# Patient Record
Sex: Male | Born: 1970 | Race: White | Hispanic: Yes | Marital: Single | Smoking: Current every day smoker
Health system: Southern US, Community
[De-identification: ages and names within clinical notes are randomized; demographics above are authoritative.]

## PROBLEM LIST (undated history)

## (undated) DIAGNOSIS — R55 Syncope and collapse: Secondary | ICD-10-CM

## (undated) HISTORY — PX: APPENDECTOMY: SHX54

---

## 2003-12-11 ENCOUNTER — Emergency Department (HOSPITAL_COMMUNITY): Admission: EM | Admit: 2003-12-11 | Discharge: 2003-12-11 | Payer: Self-pay | Admitting: *Deleted

## 2003-12-17 ENCOUNTER — Emergency Department (HOSPITAL_COMMUNITY): Admission: EM | Admit: 2003-12-17 | Discharge: 2003-12-17 | Payer: Self-pay | Admitting: Emergency Medicine

## 2003-12-17 ENCOUNTER — Emergency Department (HOSPITAL_COMMUNITY): Admission: EM | Admit: 2003-12-17 | Discharge: 2003-12-17 | Payer: Self-pay

## 2003-12-21 ENCOUNTER — Emergency Department (HOSPITAL_COMMUNITY): Admission: EM | Admit: 2003-12-21 | Discharge: 2003-12-21 | Payer: Self-pay | Admitting: Emergency Medicine

## 2005-12-27 IMAGING — CR DG CHEST 2V
2 series · 2 of 2 positions shown · non-contrast
Comparison: none

CLINICAL DATA: 33-year-old male ? left lower chest pain.  
 CHEST (TWO VIEWS) 12/11/03

[view not recorded (1 of 2)]
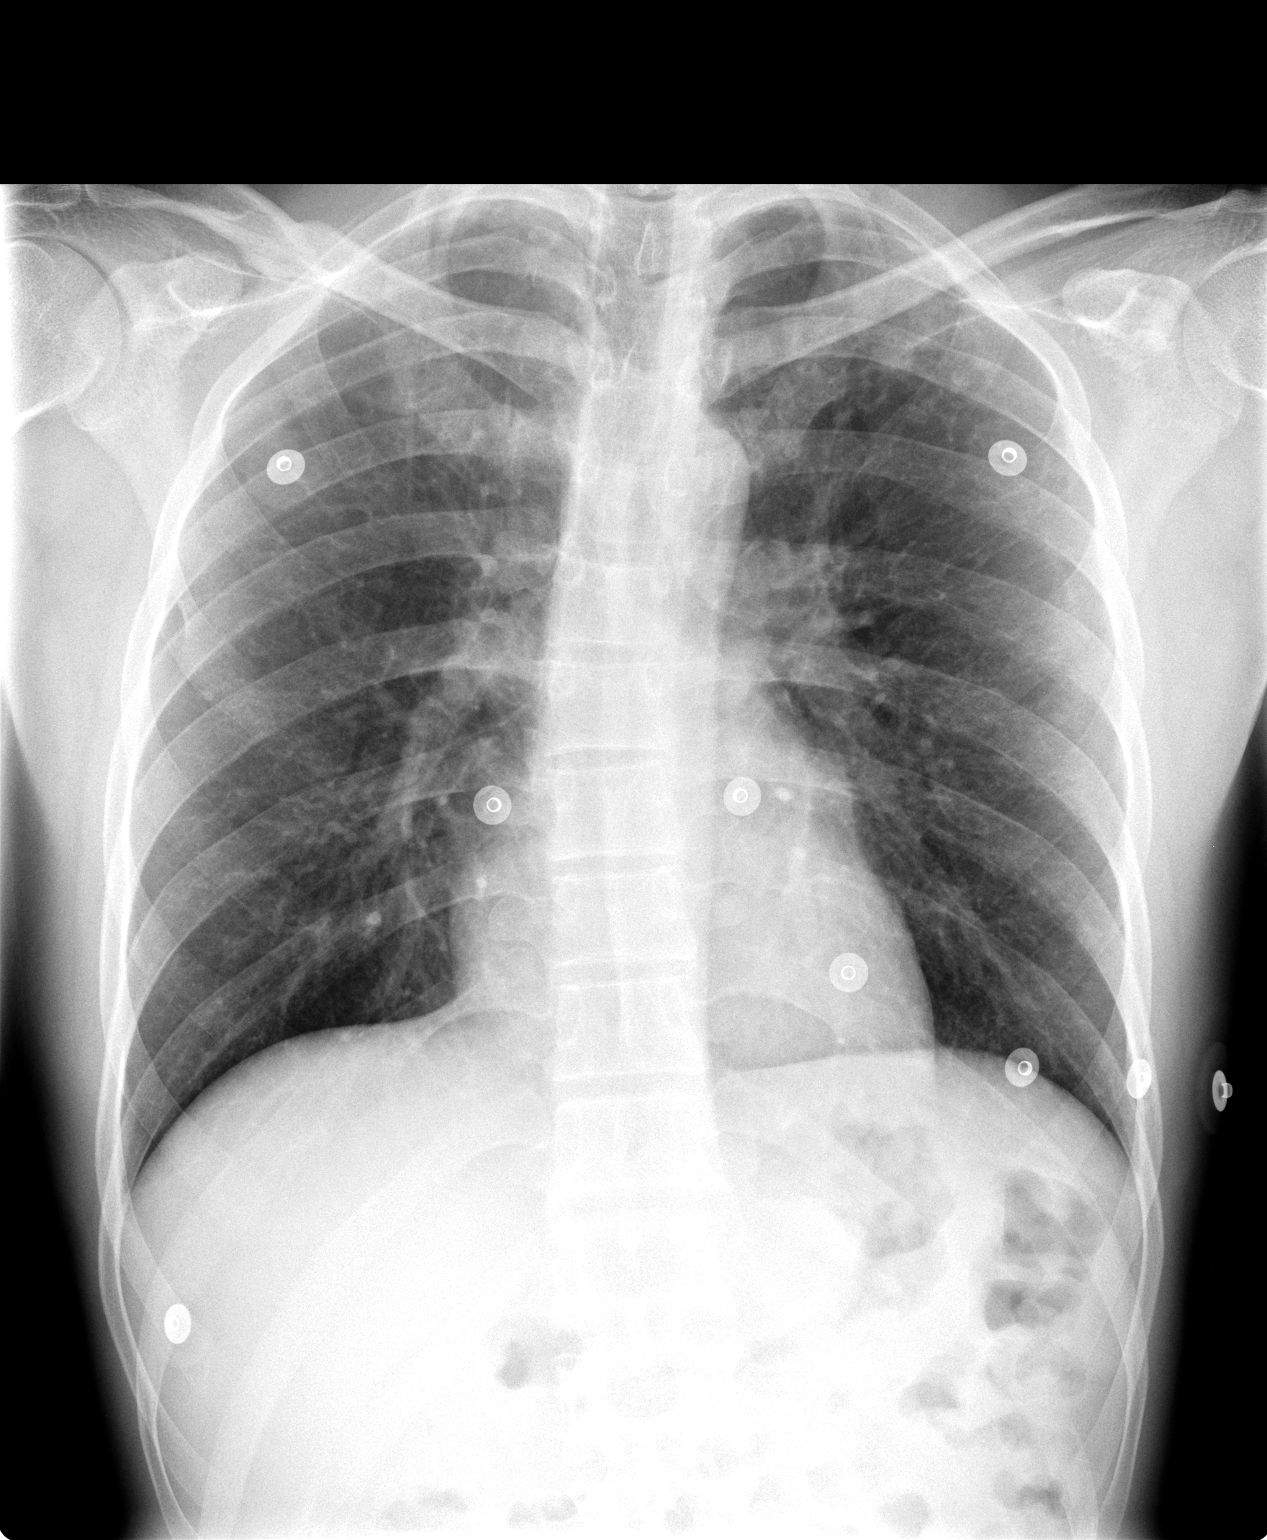

[view not recorded (2 of 2)]
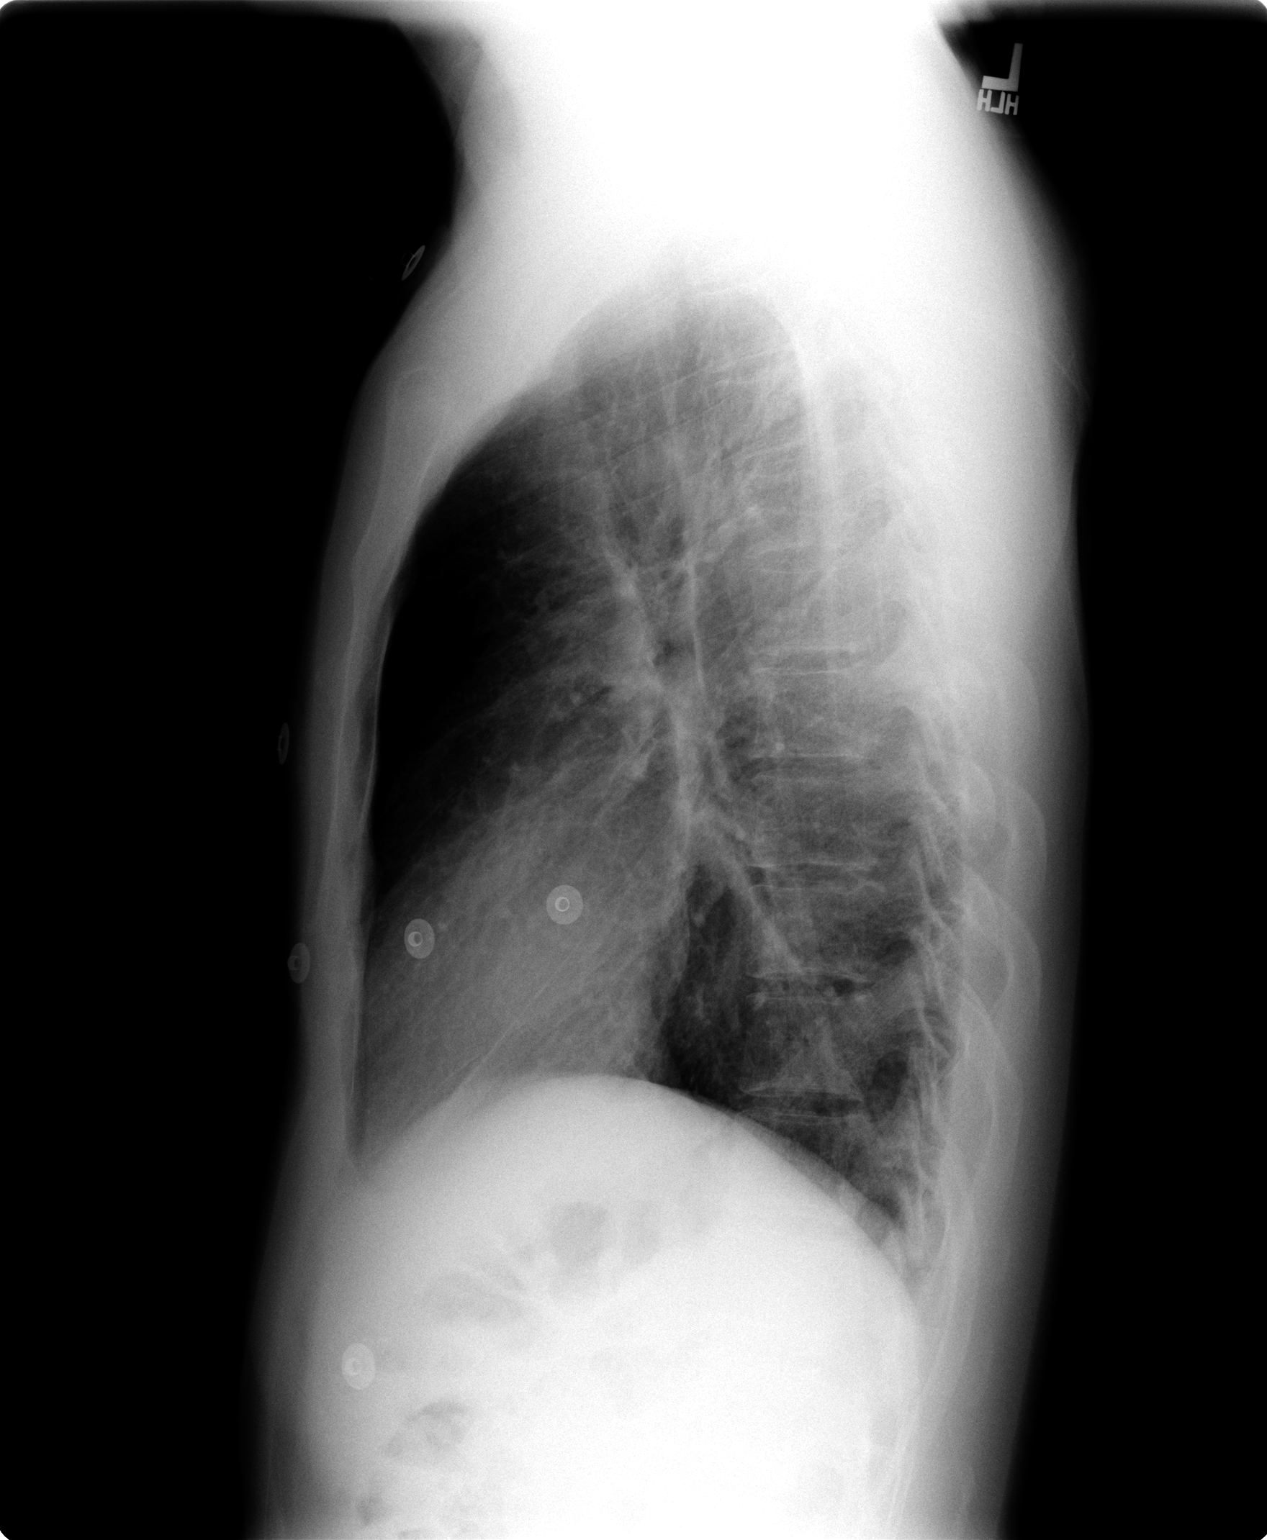

[2 of 2 positions shown; findings below may reference images not displayed]

FINDINGS: Minimal diffuse increased interstitial markings throughout the lungs with mild nodularity particularly in the left upper lobe and right mid lung.  There are no available comparisons.  Findings are nonspecific but could represent chronic interstitial disease versus atypical infection such as viral or fungal etiologies.  Recommend correlation clinically.  Heart size is normal.  No effusion.  
 IMPRESSION
 Diffuse mild reticular nodular interstitial opacities in the lungs most pronounced in the left upper lobe and right mid lung.  Chronic changes versus atypical infection.

## 2008-10-01 ENCOUNTER — Emergency Department (HOSPITAL_COMMUNITY): Admission: EM | Admit: 2008-10-01 | Discharge: 2008-10-01 | Payer: Self-pay | Admitting: Emergency Medicine

## 2010-06-13 LAB — BASIC METABOLIC PANEL
BUN: 8 mg/dL (ref 6–23)
CO2: 26 mEq/L (ref 19–32)
Calcium: 9.2 mg/dL (ref 8.4–10.5)
Chloride: 107 mEq/L (ref 96–112)
Creatinine, Ser: 0.68 mg/dL (ref 0.4–1.5)
GFR calc Af Amer: >60 mL/min (ref >60)
GFR calc non Af Amer: >60 mL/min (ref >60)
Glucose, Bld: 94 mg/dL (ref 70–99)
Potassium: 3.4 mEq/L — ABNORMAL LOW (ref 3.5–5.1)
Sodium: 136 mEq/L (ref 135–145)

## 2010-06-13 LAB — DIFFERENTIAL
Basophils Absolute: 0 10*3/uL (ref 0.0–0.1)
Basophils Relative: 1 % (ref 0–1)
Eosinophils Absolute: 0.2 10*3/uL (ref 0.0–0.7)
Eosinophils Relative: 3 % (ref 0–5)
Lymphocytes Relative: 40 % (ref 12–46)
Lymphs Abs: 2.5 10*3/uL (ref 0.7–4.0)
Monocytes Absolute: 0.4 10*3/uL (ref 0.1–1.0)
Monocytes Relative: 7 % (ref 3–12)
Neutro Abs: 3.1 10*3/uL (ref 1.7–7.7)
Neutrophils Relative %: 50 % (ref 43–77)

## 2010-06-13 LAB — CBC
HCT: 45.7 % (ref 39.0–52.0)
Hemoglobin: 15.1 g/dL (ref 13.0–17.0)
MCHC: 33.1 g/dL (ref 30.0–36.0)
MCV: 94.2 fL (ref 78.0–100.0)
Platelets: 188 10*3/uL (ref 150–400)
RBC: 4.85 MIL/uL (ref 4.22–5.81)
RDW: 13.2 % (ref 11.5–15.5)
WBC: 6.1 10*3/uL (ref 4.0–10.5)

## 2010-06-13 LAB — URINALYSIS, ROUTINE W REFLEX MICROSCOPIC
Bilirubin Urine: NEGATIVE
Glucose, UA: NEGATIVE mg/dL
Hgb urine dipstick: NEGATIVE
Ketones, ur: NEGATIVE mg/dL
Nitrite: NEGATIVE
Protein, ur: NEGATIVE mg/dL
Specific Gravity, Urine: 1.013 (ref 1.005–1.030)
Urobilinogen, UA: 0.2 mg/dL (ref 0.0–1.0)
pH: 7 (ref 5.0–8.0)

## 2010-06-13 LAB — POCT CARDIAC MARKERS
CKMB, poc: 1.7 ng/mL (ref 1.0–8.0)
Myoglobin, poc: 55.1 ng/mL (ref 12–200)
Troponin i, poc: <0.05 ng/mL (ref 0.00–0.09)

## 2015-08-15 ENCOUNTER — Emergency Department (HOSPITAL_COMMUNITY): Payer: Self-pay

## 2015-08-15 ENCOUNTER — Observation Stay (HOSPITAL_COMMUNITY)
Admission: EM | Admit: 2015-08-15 | Discharge: 2015-08-17 | Disposition: A | Discharge status: Home / Self Care | Payer: Self-pay | Attending: Internal Medicine | Admitting: Internal Medicine

## 2015-08-15 ENCOUNTER — Encounter (HOSPITAL_COMMUNITY): Payer: Self-pay | Admitting: Emergency Medicine

## 2015-08-15 DIAGNOSIS — F1290 Cannabis use, unspecified, uncomplicated: Secondary | ICD-10-CM | POA: Diagnosis present

## 2015-08-15 DIAGNOSIS — R55 Syncope and collapse: Principal | ICD-10-CM | POA: Insufficient documentation

## 2015-08-15 DIAGNOSIS — F17200 Nicotine dependence, unspecified, uncomplicated: Secondary | ICD-10-CM | POA: Insufficient documentation

## 2015-08-15 HISTORY — DX: Syncope and collapse: R55

## 2015-08-15 LAB — CBC
HCT: 38.9 % — ABNORMAL LOW (ref 39.0–52.0)
Hemoglobin: 13.3 g/dL (ref 13.0–17.0)
MCH: 30.9 pg (ref 26.0–34.0)
MCHC: 34.2 g/dL (ref 30.0–36.0)
MCV: 90.3 fL (ref 78.0–100.0)
Platelets: 210 10*3/uL (ref 150–400)
RBC: 4.31 MIL/uL (ref 4.22–5.81)
RDW: 12.8 % (ref 11.5–15.5)
WBC: 11.4 10*3/uL — ABNORMAL HIGH (ref 4.0–10.5)

## 2015-08-15 LAB — BASIC METABOLIC PANEL
Anion gap: 7 (ref 5–15)
BUN: 14 mg/dL (ref 6–20)
CO2: 20 mmol/L — ABNORMAL LOW (ref 22–32)
Calcium: 8.4 mg/dL — ABNORMAL LOW (ref 8.9–10.3)
Chloride: 107 mmol/L (ref 101–111)
Creatinine, Ser: 0.93 mg/dL (ref 0.61–1.24)
GFR calc Af Amer: >60 mL/min (ref >60)
GFR calc non Af Amer: >60 mL/min (ref >60)
Glucose, Bld: 149 mg/dL — ABNORMAL HIGH (ref 65–99)
Potassium: 3.3 mmol/L — ABNORMAL LOW (ref 3.5–5.1)
Sodium: 134 mmol/L — ABNORMAL LOW (ref 135–145)

## 2015-08-15 LAB — I-STAT CHEM 8, ED
BUN: 17 mg/dL (ref 6–20)
Calcium, Ion: 1.06 mmol/L — ABNORMAL LOW (ref 1.12–1.23)
Chloride: 103 mmol/L (ref 101–111)
Creatinine, Ser: 0.9 mg/dL (ref 0.61–1.24)
Glucose, Bld: 151 mg/dL — ABNORMAL HIGH (ref 65–99)
HCT: 42 % (ref 39.0–52.0)
Hemoglobin: 14.3 g/dL (ref 13.0–17.0)
Potassium: 3.3 mmol/L — ABNORMAL LOW (ref 3.5–5.1)
Sodium: 140 mmol/L (ref 135–145)
TCO2: 22 mmol/L (ref 0–100)

## 2015-08-15 LAB — PROTIME-INR
INR: 1.23 (ref 0.00–1.49)
Prothrombin Time: 15.6 seconds — ABNORMAL HIGH (ref 11.6–15.2)

## 2015-08-15 LAB — I-STAT TROPONIN, ED: Troponin i, poc: 0.01 ng/mL (ref 0.00–0.08)

## 2015-08-15 LAB — PHOSPHORUS: Phosphorus: 2.2 mg/dL — ABNORMAL LOW (ref 2.5–4.6)

## 2015-08-15 LAB — MAGNESIUM: Magnesium: 2 mg/dL (ref 1.7–2.4)

## 2015-08-15 MED ORDER — POTASSIUM CHLORIDE CRYS ER 20 MEQ PO TBCR
40.0000 meq | EXTENDED_RELEASE_TABLET | Freq: Once | ORAL | Status: AC
  Administered 2015-08-16: 40 meq via ORAL
  Filled 2015-08-15: qty 2

## 2015-08-15 NOTE — ED Provider Notes (Signed)
CSN: [id number]     Arrival date & time 08/15/15  2142 History   First MD Initiated Contact with Patient 08/15/15 2147     I am a certified bilingual provider. History and physical obtained in Spanish as preferred by patient and wife.  Chief Complaint  Patient presents with  . Shortness of Breath   Patient is a 45 y.o. male presenting with shortness of breath.  Shortness of Breath Severity:  Severe Onset quality:  Sudden Timing:  Sporadic Progression:  Resolved Chronicity:  Recurrent Context: not activity and not URI   Relieved by:  Nothing Ineffective treatments:  Rest (ASA, nitro) Associated symptoms: diaphoresis   Associated symptoms: no abdominal pain, no chest pain, no claudication, no cough and no hemoptysis   Risk factors: no hx of cancer, no hx of PE/DVT, no obesity, no prolonged immobilization and no recent surgery    Previously healthy male with no history of hypertension or diabetes but who does endorse tobacco abuse presents with syncopal episode. Patient states he was in his usual state of health when he suddenly felt short of breath, lightheaded, diaphoretic and with numbness to his left upper extremity. He subsequently passed out for a few seconds. EMS was called and they obtain an EKG that was concerning for STEMI. He received aspirin, nitroglycerin which did not relief him.  Past Medical History  Diagnosis Date  . Syncope    Past Surgical History  Procedure Laterality Date  . Appendectomy     Family History  Problem Relation Age of Onset  . Syncope episode Neg Hx    Social History  Substance Use Topics  . Smoking status: Current Every Day Smoker  . Smokeless tobacco: None  . Alcohol Use: Yes    Review of Systems  Constitutional: Positive for diaphoresis.  Respiratory: Positive for shortness of breath. Negative for cough and hemoptysis.   Cardiovascular: Negative for chest pain and claudication.  Gastrointestinal: Negative for abdominal pain.       Allergies  Review of patient's allergies indicates no known allergies.  Home Medications   Prior to Admission medications   Not on File   BP 104/63 mmHg  Pulse 82  Temp(Src) 97.8 F (36.6 C) (Oral)  Resp 16  SpO2 100% Physical Exam  Constitutional: He appears well-developed and well-nourished. No distress.  HENT:  Head: Normocephalic and atraumatic.  Left Ear: External ear normal.  Eyes: Conjunctivae are normal. Pupils are equal, round, and reactive to light. Right eye exhibits no discharge. Left eye exhibits no discharge.  Neck: Normal range of motion. Neck supple.  Cardiovascular: Normal rate and regular rhythm.   No murmur heard. Pulmonary/Chest: Effort normal and breath sounds normal. No respiratory distress.  Abdominal: Soft. Bowel sounds are normal. He exhibits no distension and no mass. There is no tenderness. There is no rebound and no guarding.  Musculoskeletal: He exhibits no edema (of LEs).  Neurological: He is alert.  Skin: Skin is warm. He is not diaphoretic.  Psychiatric: He has a normal mood and affect.  Nursing note and vitals reviewed.   ED Course  Procedures  Labs Review Labs Reviewed  BASIC METABOLIC PANEL - Abnormal; Notable for the following:    Sodium 134 (*)    Potassium 3.3 (*)    CO2 20 (*)    Glucose, Bld 149 (*)    Calcium 8.4 (*)    All other components within normal limits  CBC - Abnormal; Notable for the following:    WBC 11.4 (*)  HCT 38.9 (*)    All other components within normal limits  PROTIME-INR - Abnormal; Notable for the following:    Prothrombin Time 15.6 (*)    All other components within normal limits  PHOSPHORUS - Abnormal; Notable for the following:    Phosphorus 2.2 (*)    All other components within normal limits  BASIC METABOLIC PANEL - Abnormal; Notable for the following:    CO2 20 (*)    Glucose, Bld 110 (*)    Calcium 8.8 (*)    All other components within normal limits  URINE RAPID DRUG SCREEN,  HOSP PERFORMED - Abnormal; Notable for the following:    Tetrahydrocannabinol POSITIVE (*)    All other components within normal limits  BASIC METABOLIC PANEL - Abnormal; Notable for the following:    Glucose, Bld 104 (*)    All other components within normal limits  I-STAT CHEM 8, ED - Abnormal; Notable for the following:    Potassium 3.3 (*)    Glucose, Bld 151 (*)    Calcium, Ion 1.06 (*)    All other components within normal limits  MAGNESIUM  TROPONIN I  TROPONIN I  TROPONIN I  CBC  CREATININE, SERUM  CBC  CK  PHOSPHORUS  I-STAT TROPOININ, ED    Imaging Review Ct Angio Chest Pe W/cm &/or Wo Cm  08/16/2015  CLINICAL DATA:  45 year old male with syncope. EXAM: CT ANGIOGRAPHY CHEST WITH CONTRAST TECHNIQUE: Multidetector CT imaging of the chest was performed using the standard protocol during bolus administration of intravenous contrast. Multiplanar CT image reconstructions and MIPs were obtained to evaluate the vascular anatomy. CONTRAST:  100 cc Isovue 370 COMPARISON:  Chest radiograph dated 08/15/2015 FINDINGS: There are biapical scarring. A 6 x 13 mm area of nodularity in the left upper lobe extending into the left pleural surface likely represent scarring. A 7 mm nodular density in the left apical region noted, also possibly scarring. Direct comparison with prior chest CTs, if available, or follow-up recommended to document stability. The lungs are otherwise clear. A 4 mm calcific focus in the right middle lobe likely related to old granuloma. There is no pleural effusion or pneumothorax. The central airways are patent. The thoracic aorta appears unremarkable. There is no CT evidence of pulmonary embolism. There is no cardiomegaly or pericardial effusion. There is no hilar or mediastinal adenopathy. The esophagus is grossly unremarkable. There is no axillary adenopathy. The chest wall soft tissues appear unremarkable. The osseous structures are intact. The visualized upper abdomen is  unremarkable. Review of the MIP images confirms the above findings. IMPRESSION: No CT evidence of pulmonary embolism. Biapical nodularity and scarring. A 7 mm left upper lobe pulmonary nodules versus scarring. Non-contrast chest CT at 6-12 months is recommended. If the nodule is stable at time of repeat CT, then future CT at 18-24 months (from today's scan) is considered optional for low-risk patients, but is recommended for high-risk patients. This recommendation follows the consensus statement: Guidelines for Management of Incidental Pulmonary Nodules Detected on CT Images:From the Fleischner Society 2017; published online before print (10.1148/radiol.9604540981). Electronically Signed   By: Elgie Collard M.D.   On: 08/16/2015 02:11   Dg Chest Portable 1 View  08/15/2015  CLINICAL DATA:  Shortness of breath. EXAM: PORTABLE CHEST 1 VIEW COMPARISON:  10/01/2008. FINDINGS: The normal sized heart. Probable heart shaped artifact overlying the left hilar region with associated leads. Otherwise, clear lungs. Mild scoliosis. IMPRESSION: No acute abnormality. Electronically Signed   By: Viviann Spare  Azucena Kuba M.D.   On: 08/15/2015 22:15   I have personally reviewed and evaluated these images and lab results as part of my medical decision-making.   EKG Interpretation None      MDM   Final diagnoses:  Syncope and collapse  Syncope    History had a concerning for arrhythmia however initial EKG without obvious incident. Could send initially called but given EKG at baseline when compared to prior, canceled per cardiology. Patient has had multiple similar episodes in the past, most recent 4 months ago. No history of sudden cardiac death in the family. Patient otherwise healthy except for tobacco abuse. Denies any drug use except marijuana. Concern for dissection versus PE. Blood pressure and pulse not significantly different between 2 arms and has bilateral pulses. Labs unremarkable except for mild hypokalemia, which  we replaced. CTPA obtained to further assess and negative. Will admit to syncope are brisk for further management and observation.   Sidney Ace, MD 08/17/15 1440  Alvira Monday, MD 08/19/15 [id number]

## 2015-08-15 NOTE — ED Notes (Signed)
Pt. arrived with EMS from home report SOB with chest tightness and brief LOC onset this evening , denies chest pain at arrival , received ASA 324 mg. po and 2 NTG sl with no relief.

## 2015-08-15 NOTE — Progress Notes (Signed)
   08/15/15 2200  Clinical Encounter Type  Visited With Family  Visit Type ED  Referral From Nurse  Spiritual Encounters  Spiritual Needs Emotional  Stress Factors  Family Stress Factors Health changes;Other (Comment) (language issues)  Provided hospitality to wife of patient. Emiah Pellicano, Chaplain

## 2015-08-15 NOTE — ED Notes (Addendum)
Dr. Arbutus PedMohamed ( cardiologist) cancelled code STEMI .

## 2015-08-16 ENCOUNTER — Observation Stay (HOSPITAL_COMMUNITY): Payer: MEDICAID

## 2015-08-16 ENCOUNTER — Encounter (HOSPITAL_COMMUNITY): Payer: Self-pay | Admitting: Family Medicine

## 2015-08-16 ENCOUNTER — Observation Stay (HOSPITAL_COMMUNITY): Payer: Self-pay

## 2015-08-16 DIAGNOSIS — R55 Syncope and collapse: Secondary | ICD-10-CM | POA: Diagnosis present

## 2015-08-16 DIAGNOSIS — F1210 Cannabis abuse, uncomplicated: Secondary | ICD-10-CM

## 2015-08-16 DIAGNOSIS — F1290 Cannabis use, unspecified, uncomplicated: Secondary | ICD-10-CM | POA: Diagnosis present

## 2015-08-16 LAB — RAPID URINE DRUG SCREEN, HOSP PERFORMED
AMPHETAMINES: NOT DETECTED
Barbiturates: NOT DETECTED
Benzodiazepines: NOT DETECTED
Cocaine: NOT DETECTED
Opiates: NOT DETECTED
Tetrahydrocannabinol: POSITIVE — AB

## 2015-08-16 LAB — BASIC METABOLIC PANEL
Anion gap: 9 (ref 5–15)
BUN: 11 mg/dL (ref 6–20)
CALCIUM: 8.8 mg/dL — AB (ref 8.9–10.3)
CO2: 20 mmol/L — AB (ref 22–32)
CREATININE: 0.72 mg/dL (ref 0.61–1.24)
Chloride: 107 mmol/L (ref 101–111)
GFR calc Af Amer: >60 mL/min (ref >60)
GFR calc non Af Amer: >60 mL/min (ref >60)
GLUCOSE: 110 mg/dL — AB (ref 65–99)
Potassium: 3.7 mmol/L (ref 3.5–5.1)
Sodium: 136 mmol/L (ref 135–145)

## 2015-08-16 LAB — TROPONIN I

## 2015-08-16 LAB — CBC
HCT: 40.1 % (ref 39.0–52.0)
HCT: 40.6 % (ref 39.0–52.0)
HEMOGLOBIN: 13.9 g/dL (ref 13.0–17.0)
HEMOGLOBIN: 13.8 g/dL (ref 13.0–17.0)
MCH: 31.4 pg (ref 26.0–34.0)
MCH: 31 pg (ref 26.0–34.0)
MCHC: 34.7 g/dL (ref 30.0–36.0)
MCHC: 34 g/dL (ref 30.0–36.0)
MCV: 90.5 fL (ref 78.0–100.0)
MCV: 91.2 fL (ref 78.0–100.0)
PLATELETS: 222 10*3/uL (ref 150–400)
PLATELETS: 218 10*3/uL (ref 150–400)
RBC: 4.43 MIL/uL (ref 4.22–5.81)
RBC: 4.45 MIL/uL (ref 4.22–5.81)
RDW: 12.9 % (ref 11.5–15.5)
RDW: 12.9 % (ref 11.5–15.5)
WBC: 8 10*3/uL (ref 4.0–10.5)
WBC: 7.2 10*3/uL (ref 4.0–10.5)

## 2015-08-16 LAB — CREATININE, SERUM
CREATININE: 0.71 mg/dL (ref 0.61–1.24)
GFR calc Af Amer: >60 mL/min (ref >60)

## 2015-08-16 LAB — CK: CK TOTAL: 152 U/L (ref 49–397)

## 2015-08-16 MED ORDER — K PHOS MONO-SOD PHOS DI & MONO 155-852-130 MG PO TABS
500.0000 mg | ORAL_TABLET | Freq: Two times a day (BID) | ORAL | Status: AC
  Administered 2015-08-16 (×2): 500 mg via ORAL
  Filled 2015-08-16 (×2): qty 2

## 2015-08-16 MED ORDER — IOPAMIDOL (ISOVUE-370) INJECTION 76%
INTRAVENOUS | Status: AC
  Administered 2015-08-16: 100 mL
  Filled 2015-08-16: qty 100

## 2015-08-16 MED ORDER — SODIUM CHLORIDE 0.9% FLUSH
3.0000 mL | Freq: Two times a day (BID) | INTRAVENOUS | Status: DC
  Administered 2015-08-16 – 2015-08-17 (×3): 3 mL via INTRAVENOUS

## 2015-08-16 MED ORDER — SODIUM CHLORIDE 0.9 % IV SOLN
INTRAVENOUS | Status: AC
  Administered 2015-08-16: 125 mL/h via INTRAVENOUS

## 2015-08-16 MED ORDER — ENOXAPARIN SODIUM 40 MG/0.4ML ~~LOC~~ SOLN
40.0000 mg | Freq: Every day | SUBCUTANEOUS | Status: DC
  Filled 2015-08-16 (×2): qty 0.4

## 2015-08-16 NOTE — ED Notes (Signed)
MD at bedside. Hospitalist

## 2015-08-16 NOTE — H&P (Signed)
History and Physical    Dmarcus Decicco ZOX:[id number] DOB: 1970/12/05 DOA: 08/15/2015  PCP: No PCP Per Patient  Patient coming from: home  Interpreter used together this history, Spanish.  Chief Complaint: "I got worried because I passed out."  HPI: Justin Valentine is a 45 y.o. male with no medical history of significance. Patient states about 5 years ago he would have episodes of syncope but then they resolved. Patient states that over the last 5 months he's had episodes of syncope. He has an episode of syncope about every 1-3 weeks. Patient became alarmed today because he had 2 episodes of syncope in the same day. His episodes of syncope last about 1-3 minutes. Patient states that after he passes out he sometimes has some upper and lower extremity on the left side weakness and aching.  Patient states that he only sleeps about 6 hours night. Patient also works outside as a Education administrator and states that he drinks very little water and does very little to keep himself hydrated.  ED Course: Sardiology called for STEMI. It was decided that EKG did not reflect a STEMI. EKG at baseline. Patient now getting admitted for syncopal workup.  Review of Systems: As per HPI otherwise 10 point review of systems negative.    History reviewed. No pertinent past medical history.  Past Surgical History  Procedure Laterality Date  . Appendectomy       reports that he has been smoking.  He does not have any smokeless tobacco history on file. He reports that he drinks alcohol. He reports that he uses illicit drugs (Marijuana).  No Known Allergies  No family history on file.   Prior to Admission medications   Not on File    Physical Exam:  Constitutional: NAD, calm, comfortable Filed Vitals:   08/15/15 2215 08/15/15 2230 08/15/15 2316 08/16/15 0015  BP: 97/60 97/60  127/70  Pulse: 58 63  68  Temp:      TempSrc:      Resp: 14 15    SpO2: 99% 100% 100% 100%   Eyes: PERRL, lids and  conjunctivae normal ENMT: Mucous membranes are dry. Posterior pharynx clear of any exudate or lesions.Normal dentition.  Neck: normal, supple, no masses, no thyromegaly Respiratory: clear to auscultation bilaterally, no wheezing, no crackles. Normal respiratory effort. No accessory muscle use.  Cardiovascular: Regular rate and rhythm, no murmurs / rubs / gallops. No extremity edema. 2+ pedal pulses. No carotid bruits.  Abdomen: no tenderness, no masses palpated. No hepatosplenomegaly. Bowel sounds positive.  Musculoskeletal: no clubbing / cyanosis. No joint deformity upper and lower extremities. Good ROM, no contractures. Normal muscle tone.  Skin: no rashes, lesions, ulcers. No induration Neurologic: CN 2-12 grossly intact. Sensation intact, DTR normal. Strength 5/5 in all 4.  Psychiatric: Normal judgment and insight. Alert and oriented x 3. Normal mood.   Labs on Admission: I have personally reviewed following labs and imaging studies  CBC:  Recent Labs Lab 08/15/15 2149 08/15/15 2201  WBC 11.4*  --   HGB 13.3 14.3  HCT 38.9* 42.0  MCV 90.3  --   PLT 210  --    Basic Metabolic Panel:  Recent Labs Lab 08/15/15 2149 08/15/15 2201  NA 134* 140  K 3.3* 3.3*  CL 107 103  CO2 20*  --   GLUCOSE 149* 151*  BUN 14 17  CREATININE 0.93 0.90  CALCIUM 8.4*  --   MG 2.0  --   PHOS 2.2*  --  GFR: CrCl cannot be calculated (Unknown ideal weight.). Liver Function Tests: No results for input(s): AST, ALT, ALKPHOS, BILITOT, PROT, ALBUMIN in the last 168 hours. No results for input(s): LIPASE, AMYLASE in the last 168 hours. No results for input(s): AMMONIA in the last 168 hours. Coagulation Profile:  Recent Labs Lab 08/15/15 2149  INR 1.23   Cardiac Enzymes: No results for input(s): CKTOTAL, CKMB, CKMBINDEX, TROPONINI in the last 168 hours. BNP (last 3 results) No results for input(s): PROBNP in the last 8760 hours. HbA1C: No results for input(s): HGBA1C in the last 72  hours. CBG: No results for input(s): GLUCAP in the last 168 hours. Lipid Profile: No results for input(s): CHOL, HDL, LDLCALC, TRIG, CHOLHDL, LDLDIRECT in the last 72 hours. Thyroid Function Tests: No results for input(s): TSH, T4TOTAL, FREET4, T3FREE, THYROIDAB in the last 72 hours. Anemia Panel: No results for input(s): VITAMINB12, FOLATE, FERRITIN, TIBC, IRON, RETICCTPCT in the last 72 hours. Urine analysis:    Component Value Date/Time   COLORURINE YELLOW 10/01/2008 1250   APPEARANCEUR CLEAR 10/01/2008 1250   LABSPEC 1.013 10/01/2008 1250   PHURINE 7.0 10/01/2008 1250   GLUCOSEU NEGATIVE 10/01/2008 1250   HGBUR NEGATIVE 10/01/2008 1250   BILIRUBINUR NEGATIVE 10/01/2008 1250   KETONESUR NEGATIVE 10/01/2008 1250   PROTEINUR NEGATIVE 10/01/2008 1250   UROBILINOGEN 0.2 10/01/2008 1250   NITRITE NEGATIVE 10/01/2008 1250   LEUKOCYTESUR  10/01/2008 1250    NEGATIVE MICROSCOPIC NOT DONE ON URINES WITH NEGATIVE PROTEIN, BLOOD, LEUKOCYTES, NITRITE, OR GLUCOSE <1000 mg/dL.   Sepsis Labs: !!!!!!!!!!!!!!!!!!!!!!!!!!!!!!!!!!!!!!!!!!!! @LABRCNTIP (procalcitonin:4,lacticidven:4) )No results found for this or any previous visit (from the past 240 hour(s)).   Radiological Exams on Admission: Dg Chest Portable 1 View  08/15/2015  CLINICAL DATA:  Shortness of breath. EXAM: PORTABLE CHEST 1 VIEW COMPARISON:  10/01/2008. FINDINGS: The normal sized heart. Probable heart shaped artifact overlying the left hilar region with associated leads. Otherwise, clear lungs. Mild scoliosis. IMPRESSION: No acute abnormality. Electronically Signed   By: Beckie SaltsSteven  Reid M.D.   On: 08/15/2015 22:15    EKG: Independently reviewed. NSR  Assessment/Plan Principal Problem:   Syncope   Syncope Admitted for observation and placement of possible cardiac monitoring device Cardiology was spoken to the emergency room and felt that EKG as baseline Cardiology will follow patient the morning Orthostatics ordered Labs  consistent with dehydration as well as history Will hydrate patient Regular diet Check magnesium and alkaline phosphatase Serial troponins Will reassess the replaced left lites as needed   DVT prophylaxis: lovenox Code Status: full Family Communication: wife at bedside Disposition Plan: home Consults called: none Admission status: tele obs   Haydee SalterPhillip M Hobbs MD [facility] Pager [phone]  If 7PM-7AM, please contact night-coverage [email or web address] Password [email or web address]  08/16/2015, 12:50 AM

## 2015-08-16 NOTE — Consult Note (Signed)
Reason for Consult:   Syncope  Requesting Physician: Dr Tat Primary Cardiologist New (TT)  HPI:   45 y/o Hispanic male, works Development worker, communitydrywall, admitted after an episode of syncope. Pt interviewed through an interpreter. The pt says he has had syncope in the past- [facility]. He describes SOB followed by "sweating", then unresposiveness. He usually doesn't get these at work, only at rest. Today's episode while lying in bed. He has never had an injury related to these spells. He denies any chest pain or associated palpitations. He denies drug use except marijuana. His EKG was initially concerning for a STEMI but this was canceled by cardiologist. Troponin are negative x 3. Reportedly not orthostatic per RN.   PMHx:  Past Medical History  Diagnosis Date  . Syncope     Past Surgical History  Procedure Laterality Date  . Appendectomy      SOCHx:  reports that he has been smoking.  He does not have any smokeless tobacco history on file. He reports that he drinks alcohol. He reports that he uses illicit drugs (Marijuana).Work Insurance claims handlerhanging drywall.  FAMHx: Family History  Problem Relation Age of Onset  . Syncope episode Neg Hx   He denies any family history of CAD, MI, or syncope  ALLERGIES: No Known Allergies  ROS: Review of Systems: General: negative for chills, fever, night sweats or weight changes.  Cardiovascular: negative for chest pain, dyspnea on exertion, edema, orthopnea, palpitations, paroxysmal nocturnal dyspnea or shortness of breath HEENT: negative for any visual disturbances, blindness, glaucoma Dermatological: negative for rash Respiratory: negative for cough, hemoptysis, or wheezing Urologic: negative for hematuria or dysuria Abdominal: negative for nausea, vomiting, diarrhea, bright red blood per rectum, melena, or hematemesis Neurologic: negative for visual changes, or dizziness Musculoskeletal: negative for back pain, joint pain, or  swelling Psych: cooperative and appropriate All other systems reviewed and are otherwise negative except as noted above.   HOME MEDICATIONS: Prior to Admission medications   Not on File    HOSPITAL MEDICATIONS: I have reviewed the patient's current medications.  VITALS: Blood pressure 119/72, pulse 60, temperature 98.2 F (36.8 C), temperature source Oral, resp. rate 19, height 5\' 5"  (1.651 m), weight 138 lb (62.596 kg), SpO2 100 %.  PHYSICAL EXAM: General appearance: alert, cooperative and no distress Neck: no carotid bruit and no JVD Lungs: clear to auscultation bilaterally Heart: regular rate and rhythm Abdomen: soft, non-tender; bowel sounds normal; no masses,  no organomegaly Extremities: extremities normal, atraumatic, no cyanosis or edema Pulses: 2+ and symmetric Skin: Skin color, texture, turgor normal. No rashes or lesions Neurologic: Grossly normal Musculoskeleta:  No obvious deformities, good muscle stength  LABS: Results for orders placed or performed during the hospital encounter of 08/15/15 (from the past 24 hour(s))  Basic metabolic panel     Status: Abnormal   Collection Time: 08/15/15  9:49 PM  Result Value Ref Range   Sodium 134 (L) 135 - 145 mmol/L   Potassium 3.3 (L) 3.5 - 5.1 mmol/L   Chloride 107 101 - 111 mmol/L   CO2 20 (L) 22 - 32 mmol/L   Glucose, Bld 149 (H) 65 - 99 mg/dL   BUN 14 6 - 20 mg/dL   Creatinine, Ser 1.610.93 0.61 - 1.24 mg/dL   Calcium 8.4 (L) 8.9 - 10.3 mg/dL   GFR calc non Af Amer >60 >60 mL/min   GFR calc Af Amer >60 >60 mL/min   Anion gap  7 5 - 15  CBC     Status: Abnormal   Collection Time: 08/15/15  9:49 PM  Result Value Ref Range   WBC 11.4 (H) 4.0 - 10.5 K/uL   RBC 4.31 4.22 - 5.81 MIL/uL   Hemoglobin 13.3 13.0 - 17.0 g/dL   HCT 16.1 (L) 09.6 - 04.5 %   MCV 90.3 78.0 - 100.0 fL   MCH 30.9 26.0 - 34.0 pg   MCHC 34.2 30.0 - 36.0 g/dL   RDW 40.9 81.1 - 91.4 %   Platelets 210 150 - 400 K/uL  Protime-INR     Status:  Abnormal   Collection Time: 08/15/15  9:49 PM  Result Value Ref Range   Prothrombin Time 15.6 (H) 11.6 - 15.2 seconds   INR 1.23 0.00 - 1.49  Magnesium     Status: None   Collection Time: 08/15/15  9:49 PM  Result Value Ref Range   Magnesium 2.0 1.7 - 2.4 mg/dL  Phosphorus     Status: Abnormal   Collection Time: 08/15/15  9:49 PM  Result Value Ref Range   Phosphorus 2.2 (L) 2.5 - 4.6 mg/dL  I-stat troponin, ED     Status: None   Collection Time: 08/15/15  9:59 PM  Result Value Ref Range   Troponin i, poc 0.01 0.00 - 0.08 ng/mL   Comment 3          I-stat chem 8, ed     Status: Abnormal   Collection Time: 08/15/15 10:01 PM  Result Value Ref Range   Sodium 140 135 - 145 mmol/L   Potassium 3.3 (L) 3.5 - 5.1 mmol/L   Chloride 103 101 - 111 mmol/L   BUN 17 6 - 20 mg/dL   Creatinine, Ser 7.82 0.61 - 1.24 mg/dL   Glucose, Bld 956 (H) 65 - 99 mg/dL   Calcium, Ion 2.13 (L) 1.12 - 1.23 mmol/L   TCO2 22 0 - 100 mmol/L   Hemoglobin 14.3 13.0 - 17.0 g/dL   HCT 08.6 57.8 - 46.9 %  Troponin I (q 6hr x 3)     Status: None   Collection Time: 08/16/15  2:25 AM  Result Value Ref Range   Troponin I <0.03 <0.031 ng/mL  CBC     Status: None   Collection Time: 08/16/15  2:25 AM  Result Value Ref Range   WBC 8.0 4.0 - 10.5 K/uL   RBC 4.43 4.22 - 5.81 MIL/uL   Hemoglobin 13.9 13.0 - 17.0 g/dL   HCT 62.9 52.8 - 41.3 %   MCV 90.5 78.0 - 100.0 fL   MCH 31.4 26.0 - 34.0 pg   MCHC 34.7 30.0 - 36.0 g/dL   RDW 24.4 01.0 - 27.2 %   Platelets 222 150 - 400 K/uL  Creatinine, serum     Status: None   Collection Time: 08/16/15  2:25 AM  Result Value Ref Range   Creatinine, Ser 0.71 0.61 - 1.24 mg/dL   GFR calc non Af Amer >60 >60 mL/min   GFR calc Af Amer >60 >60 mL/min  Troponin I (q 6hr x 3)     Status: None   Collection Time: 08/16/15  6:10 AM  Result Value Ref Range   Troponin I <0.03 <0.031 ng/mL  Basic metabolic panel     Status: Abnormal   Collection Time: 08/16/15  6:10 AM  Result Value  Ref Range   Sodium 136 135 - 145 mmol/L   Potassium 3.7 3.5 - 5.1 mmol/L  Chloride 107 101 - 111 mmol/L   CO2 20 (L) 22 - 32 mmol/L   Glucose, Bld 110 (H) 65 - 99 mg/dL   BUN 11 6 - 20 mg/dL   Creatinine, Ser 4.09 0.61 - 1.24 mg/dL   Calcium 8.8 (L) 8.9 - 10.3 mg/dL   GFR calc non Af Amer >60 >60 mL/min   GFR calc Af Amer >60 >60 mL/min   Anion gap 9 5 - 15  CBC     Status: None   Collection Time: 08/16/15  6:10 AM  Result Value Ref Range   WBC 7.2 4.0 - 10.5 K/uL   RBC 4.45 4.22 - 5.81 MIL/uL   Hemoglobin 13.8 13.0 - 17.0 g/dL   HCT 81.1 91.4 - 78.2 %   MCV 91.2 78.0 - 100.0 fL   MCH 31.0 26.0 - 34.0 pg   MCHC 34.0 30.0 - 36.0 g/dL   RDW 95.6 21.3 - 08.6 %   Platelets 218 150 - 400 K/uL  Troponin I (q 6hr x 3)     Status: None   Collection Time: 08/16/15  9:09 AM  Result Value Ref Range   Troponin I <0.03 <0.031 ng/mL    EKG: NSR, NSST changes  IMAGING: Ct Angio Chest Pe W/cm &/or Wo Cm  08/16/2015  CLINICAL DATA:  45 year old male with syncope. EXAM: CT ANGIOGRAPHY CHEST WITH CONTRAST TECHNIQUE: Multidetector CT imaging of the chest was performed using the standard protocol during bolus administration of intravenous contrast. Multiplanar CT image reconstructions and MIPs were obtained to evaluate the vascular anatomy. CONTRAST:  100 cc Isovue 370 COMPARISON:  Chest radiograph dated 08/15/2015 FINDINGS: There are biapical scarring. A 6 x 13 mm area of nodularity in the left upper lobe extending into the left pleural surface likely represent scarring. A 7 mm nodular density in the left apical region noted, also possibly scarring. Direct comparison with prior chest CTs, if available, or follow-up recommended to document stability. The lungs are otherwise clear. A 4 mm calcific focus in the right middle lobe likely related to old granuloma. There is no pleural effusion or pneumothorax. The central airways are patent. The thoracic aorta appears unremarkable. There is no CT evidence of  pulmonary embolism. There is no cardiomegaly or pericardial effusion. There is no hilar or mediastinal adenopathy. The esophagus is grossly unremarkable. There is no axillary adenopathy. The chest wall soft tissues appear unremarkable. The osseous structures are intact. The visualized upper abdomen is unremarkable. Review of the MIP images confirms the above findings. IMPRESSION: No CT evidence of pulmonary embolism. Biapical nodularity and scarring. A 7 mm left upper lobe pulmonary nodules versus scarring. Non-contrast chest CT at 6-12 months is recommended. If the nodule is stable at time of repeat CT, then future CT at 18-24 months (from today's scan) is considered optional for low-risk patients, but is recommended for high-risk patients. This recommendation follows the consensus statement: Guidelines for Management of Incidental Pulmonary Nodules Detected on CT Images:From the Fleischner Society 2017; published online before print (10.1148/radiol.[id number]). Electronically Signed   By: Elgie Collard M.D.   On: 08/16/2015 02:11   Dg Chest Portable 1 View  08/15/2015  CLINICAL DATA:  Shortness of breath. EXAM: PORTABLE CHEST 1 VIEW COMPARISON:  10/01/2008. FINDINGS: The normal sized heart. Probable heart shaped artifact overlying the left hilar region with associated leads. Otherwise, clear lungs. Mild scoliosis. IMPRESSION: No acute abnormality. Electronically Signed   By: Beckie Salts M.D.   On: 08/15/2015 22:15  IMPRESSION: Principal Problem:   Syncope and collapse Active Problems:   Hypophosphatemia   Marijuana use   RECOMMENDATION: Echo-(may want to do stress echo) followed by loop implant vs event monitor.  If above negative he may need Neuro eval. MD to see.   Time Spent Directly with Patient:  40 minutes  Corine Shelter, [place]  [phone] beeper 08/16/2015, 11:32 AM   Patient seen and independently examined with Corine Shelter, PA. We discussed all aspects of the encounter. I agree  with the assessment and plan as stated above.  Patient has a history of syncopal episodes for year and had a workup in 2012.  His episodes consist of all of a sudden developing acute SOB and the breaks out in a profuse sweat and passes out.  He denies any chest pain, dizziness, palpitations or nausea prior to the events.  Up until this admission, the last episode he had was 4 months ago while he was sitting but typically the episodes occur every 1-3 weeks.  The day of admission he had 2 episodes of syncope.  This occurred while he was lying in bed.  He got SOB and then diaphoretic and had syncope.  The episodes only last a few minutes but then will feel achiness on the left side of his body with weakness.  He was initially called a code STEMI in the ER but this was cancelled.  His exam is normal with no murmurs, edema and clear lungs.  Chest CT angio showed no PE.  EKG is non ischemic.  In setting of syncope while sitting or lying down, need to consider primary arrhythmia.  Also need to consider coronary ischemia.  Will make NPO after MN for nuclear stress test in am.  Check 2D echo.  If both these studies are normal then consider event monitor vs. ILR.  I have spent a total of 35 minutes with patient reviewing hospital records , telemetry, EKGs, labs and examining patient as well as establishing an assessment and plan that was discussed with the patient.  > 50% of time was spent in direct patient care.    Signed: Armanda Magic, MD [facility] 08/16/2015

## 2015-08-16 NOTE — Progress Notes (Signed)
PROGRESS NOTE  Justin Valentine ZOX:[id number] DOB: [birth year] DOA: 08/15/2015 PCP: No PCP Per Patient  Brief History:  45 y/o male with no chronic medical problems presents with syncope x 2 episodes on 08/15/15.  Pt has had hx of syncope for past 7-8 years, but for the last 5 months, he has been having more frequent episodes of syncope, approximately every 2-3 weeks.  On 08/15/15, the pt was watching a movie sitting on couch when first episode occurred.  He was laying down in bed when second episode occurred.  He denies biting tongue or urine or bowel incontinence.  However, he has had prior episodes where he has had urine incontinence.  He denies any aura prior to episodes, but states he feels "dizzy" for about one hour after his episodes. He states he has diaphoresis/sweating before his episodes without any cp or sob.  His significant other states that in the past she had seen some "shaking" activity, but not in the past few weeks.  No family hx to suggest sudden cardiac death.   In the ED, phos was 2.2, but remainder of labs essentially unremarkable.  EKG showed nonspecific T-wave changes and early repolarization.    Assessment/Plan: Syncope -echo -orthostatics negative -EEG -check CPK -remain on tele -UDS -consult cardiology -I told patient he cannot drive -CTA chest neg for PE -troponin neg x 2  Hypophosphatemia -replete  Pulmonary nodules -incidental finding on EKG  Marijuana use -cessation discussed     Disposition Plan:   Home in 1-2 ays  Family Communication:   Spouse updated at beside--total time 6140 min--0950-1030 Consultants:  cardiology  Code Status:  FULL    Subjective: Patient denies fevers, chills, headache, chest pain, dyspnea, nausea, vomiting, diarrhea, abdominal pain, dysuria, hematuria   Objective: Filed Vitals:   08/16/15 0047 08/16/15 0500 08/16/15 0503 08/16/15 0506  BP: 111/70 106/63 110/67 119/72  Pulse: 61 76 71 60  Temp: 97.8 F  (36.6 C) 98.2 F (36.8 C)    TempSrc:      Resp: 18 19    Height: 5\' 5"  (1.651 m)     Weight: 62.687 kg (138 lb 3.2 oz) 62.596 kg (138 lb)    SpO2: 100% 100%      Intake/Output Summary (Last 24 hours) at 08/16/15 1011 Last data filed at 08/16/15 0924  Gross per 24 hour  Intake    743 ml  Output      0 ml  Net    743 ml   Weight change:  Exam:   General:  Pt is alert, follows commands appropriately, not in acute distress  HEENT: No icterus, No thrush, No neck mass, [place]/AT  Cardiovascular: RRR, S1/S2, no rubs, no gallops  Respiratory: CTA bilaterally, no wheezing, no crackles, no rhonchi  Abdomen: Soft/+BS, non tender, non distended, no guarding  Extremities: No edema, No lymphangitis, No petechiae, No rashes, no synovitis   Data Reviewed: I have personally reviewed following labs and imaging studies Basic Metabolic Panel:  Recent Labs Lab 08/15/15 2149 08/15/15 2201 08/16/15 0225 08/16/15 0610  NA 134* 140  --  136  K 3.3* 3.3*  --  3.7  CL 107 103  --  107  CO2 20*  --   --  20*  GLUCOSE 149* 151*  --  110*  BUN 14 17  --  11  CREATININE 0.93 0.90 0.71 0.72  CALCIUM 8.4*  --   --  8.8*  MG  2.0  --   --   --   PHOS 2.2*  --   --   --    Liver Function Tests: No results for input(s): AST, ALT, ALKPHOS, BILITOT, PROT, ALBUMIN in the last 168 hours. No results for input(s): LIPASE, AMYLASE in the last 168 hours. No results for input(s): AMMONIA in the last 168 hours. Coagulation Profile:  Recent Labs Lab 08/15/15 2149  INR 1.23   CBC:  Recent Labs Lab 08/15/15 2149 08/15/15 2201 08/16/15 0225 08/16/15 0610  WBC 11.4*  --  8.0 7.2  HGB 13.3 14.3 13.9 13.8  HCT 38.9* 42.0 40.1 40.6  MCV 90.3  --  90.5 91.2  PLT 210  --  222 218   Cardiac Enzymes:  Recent Labs Lab 08/16/15 0225 08/16/15 0610  TROPONINI <0.03 <0.03   BNP: Invalid input(s): POCBNP CBG: No results for input(s): GLUCAP in the last 168 hours. HbA1C: No results for  input(s): HGBA1C in the last 72 hours. Urine analysis:    Component Value Date/Time   COLORURINE YELLOW 10/01/2008 1250   APPEARANCEUR CLEAR 10/01/2008 1250   LABSPEC 1.013 10/01/2008 1250   PHURINE 7.0 10/01/2008 1250   GLUCOSEU NEGATIVE 10/01/2008 1250   HGBUR NEGATIVE 10/01/2008 1250   BILIRUBINUR NEGATIVE 10/01/2008 1250   KETONESUR NEGATIVE 10/01/2008 1250   PROTEINUR NEGATIVE 10/01/2008 1250   UROBILINOGEN 0.2 10/01/2008 1250   NITRITE NEGATIVE 10/01/2008 1250   LEUKOCYTESUR  10/01/2008 1250    NEGATIVE MICROSCOPIC NOT DONE ON URINES WITH NEGATIVE PROTEIN, BLOOD, LEUKOCYTES, NITRITE, OR GLUCOSE <1000 mg/dL.   Sepsis Labs: (procalcitonin:4,lacticidven:4) )No results found for this or any previous visit (from the past 240 hour(s)).   Scheduled Meds: . enoxaparin (LOVENOX) injection  40 mg Subcutaneous Daily  . sodium chloride flush  3 mL Intravenous Q12H   Continuous Infusions:   Procedures/Studies: Ct Angio Chest Pe W/cm &/or Wo Cm  08/16/2015  CLINICAL DATA:  45 year old male with syncope. EXAM: CT ANGIOGRAPHY CHEST WITH CONTRAST TECHNIQUE: Multidetector CT imaging of the chest was performed using the standard protocol during bolus administration of intravenous contrast. Multiplanar CT image reconstructions and MIPs were obtained to evaluate the vascular anatomy. CONTRAST:  100 cc Isovue 370 COMPARISON:  Chest radiograph dated 08/15/2015 FINDINGS: There are biapical scarring. A 6 x 13 mm area of nodularity in the left upper lobe extending into the left pleural surface likely represent scarring. A 7 mm nodular density in the left apical region noted, also possibly scarring. Direct comparison with prior chest CTs, if available, or follow-up recommended to document stability. The lungs are otherwise clear. A 4 mm calcific focus in the right middle lobe likely related to old granuloma. There is no pleural effusion or pneumothorax. The central airways are patent. The thoracic  aorta appears unremarkable. There is no CT evidence of pulmonary embolism. There is no cardiomegaly or pericardial effusion. There is no hilar or mediastinal adenopathy. The esophagus is grossly unremarkable. There is no axillary adenopathy. The chest wall soft tissues appear unremarkable. The osseous structures are intact. The visualized upper abdomen is unremarkable. Review of the MIP images confirms the above findings. IMPRESSION: No CT evidence of pulmonary embolism. Biapical nodularity and scarring. A 7 mm left upper lobe pulmonary nodules versus scarring. Non-contrast chest CT at 6-12 months is recommended. If the nodule is stable at time of repeat CT, then future CT at 18-24 months (from today's scan) is considered optional for low-risk patients, but is recommended for high-risk patients. This recommendation  follows the consensus statement: Guidelines for Management of Incidental Pulmonary Nodules Detected on CT Images:From the Fleischner Society 2017; published online before print (10.1148/radiol.1610960454). Electronically Signed   By: Elgie Collard M.D.   On: 08/16/2015 02:11   Dg Chest Portable 1 View  08/15/2015  CLINICAL DATA:  Shortness of breath. EXAM: PORTABLE CHEST 1 VIEW COMPARISON:  10/01/2008. FINDINGS: The normal sized heart. Probable heart shaped artifact overlying the left hilar region with associated leads. Otherwise, clear lungs. Mild scoliosis. IMPRESSION: No acute abnormality. Electronically Signed   By: Beckie Salts M.D.   On: 08/15/2015 22:15    Amilee Janvier, DO  [facility] Pager [phone]  If 7PM-7AM, please contact night-coverage [email or web address] Password [email or web address] 08/16/2015, 10:11 AM

## 2015-08-17 ENCOUNTER — Observation Stay (HOSPITAL_BASED_OUTPATIENT_CLINIC_OR_DEPARTMENT_OTHER): Payer: Self-pay

## 2015-08-17 ENCOUNTER — Observation Stay (HOSPITAL_COMMUNITY): Payer: MEDICAID

## 2015-08-17 ENCOUNTER — Observation Stay (HOSPITAL_BASED_OUTPATIENT_CLINIC_OR_DEPARTMENT_OTHER): Payer: MEDICAID

## 2015-08-17 DIAGNOSIS — R55 Syncope and collapse: Secondary | ICD-10-CM

## 2015-08-17 LAB — NM MYOCAR MULTI W/SPECT W/WALL MOTION / EF
CHL CUP MPHR: 175 {beats}/min
CHL CUP NUCLEAR SDS: 3
CHL CUP NUCLEAR SRS: 6
CHL CUP RESTING HR STRESS: 68 {beats}/min
CSEPED: 0 min
CSEPEDS: 0 s
CSEPEW: 1 METS
LV sys vol: 42 mL
LVDIAVOL: 90 mL (ref 62–150)
NUC STRESS TID: 1.29
Peak HR: 100 {beats}/min
Percent HR: 57 %
RATE: 0
SSS: 9

## 2015-08-17 LAB — ECHOCARDIOGRAM COMPLETE
E decel time: 194 msec
E/e' ratio: 6.59
FS: 32 % (ref 28–44)
IVS/LV PW RATIO, ED: 1.1
LA ID, A-P, ES: 25 mm
LA diam end sys: 25 mm
LA diam index: 1.48 cm/m2
LA vol A4C: 18.3 ml
LA vol index: 10.2 mL/m2
LA vol: 17.3 mL
LV E/e' medial: 6.59
LV E/e'average: 6.59
LV PW d: 8.47 mm — AB (ref 0.6–1.1)
LV e' LATERAL: 14 cm/s
LVOT area: 2.54 cm2
LVOT diameter: 18 mm
MV Dec: 194
MV Peak grad: 3 mmHg
MV pk A vel: 63.1 m/s
MV pk E vel: 92.2 m/s
TAPSE: 22.4 mm
TDI e' lateral: 14
TDI e' medial: 8.05

## 2015-08-17 LAB — PHOSPHORUS: PHOSPHORUS: 4.2 mg/dL (ref 2.5–4.6)

## 2015-08-17 LAB — BASIC METABOLIC PANEL
ANION GAP: 8 (ref 5–15)
BUN: 12 mg/dL (ref 6–20)
CALCIUM: 9.1 mg/dL (ref 8.9–10.3)
CHLORIDE: 107 mmol/L (ref 101–111)
CO2: 23 mmol/L (ref 22–32)
CREATININE: 0.7 mg/dL (ref 0.61–1.24)
GFR calc non Af Amer: >60 mL/min (ref >60)
Glucose, Bld: 104 mg/dL — ABNORMAL HIGH (ref 65–99)
Potassium: 3.6 mmol/L (ref 3.5–5.1)
SODIUM: 138 mmol/L (ref 135–145)

## 2015-08-17 MED ORDER — TECHNETIUM TC 99M TETROFOSMIN IV KIT
10.0000 | PACK | Freq: Once | INTRAVENOUS | Status: AC | PRN
  Administered 2015-08-17: 10 via INTRAVENOUS

## 2015-08-17 MED ORDER — TECHNETIUM TC 99M TETROFOSMIN IV KIT
30.0000 | PACK | Freq: Once | INTRAVENOUS | Status: AC | PRN
  Administered 2015-08-17: 30 via INTRAVENOUS

## 2015-08-17 MED ORDER — REGADENOSON 0.4 MG/5ML IV SOLN
0.4000 mg | Freq: Once | INTRAVENOUS | Status: AC
  Administered 2015-08-17: 0.4 mg via INTRAVENOUS
  Filled 2015-08-17: qty 5

## 2015-08-17 MED ORDER — REGADENOSON 0.4 MG/5ML IV SOLN
INTRAVENOUS | Status: AC
  Filled 2015-08-17: qty 5

## 2015-08-17 NOTE — Progress Notes (Addendum)
PROGRESS NOTE  Subjective:   Pt admitted with syncope   Objective:    Vital Signs:   Temp:  [98 F (36.7 C)-98.7 F (37.1 C)] 98.7 F (37.1 C) (06/12 0500) Pulse Rate:  [57-68] 57 (06/12 0500) Resp:  [16-17] 16 (06/12 0500) BP: (111-122)/(61-79) 114/61 mmHg (06/12 0500) SpO2:  [98 %-100 %] 100 % (06/12 0500) Weight:  [137 lb 6.4 oz (62.324 kg)] 137 lb 6.4 oz (62.324 kg) (06/12 0500)  Last BM Date: 08/15/15   24-hour weight change: Weight change: -12.8 oz (-0.363 kg)  Weight trends: Filed Weights   08/16/15 0047 08/16/15 0500 08/17/15 0500  Weight: 138 lb 3.2 oz (62.687 kg) 138 lb (62.596 kg) 137 lb 6.4 oz (62.324 kg)    Intake/Output:  06/11 0701 - 06/12 0700 In: 980 [P.O.:480; I.V.:500] Out: 250 [Urine:250]     Physical Exam: BP 114/61 mmHg  Pulse 57  Temp(Src) 98.7 F (37.1 C) (Oral)  Resp 16  Ht 5\' 5"  (1.651 m)  Wt 137 lb 6.4 oz (62.324 kg)  BMI 22.86 kg/m2  SpO2 100%  Wt Readings from Last 3 Encounters:  08/17/15 137 lb 6.4 oz (62.324 kg)    General: Vital signs reviewed and noted.   Head: Normocephalic, atraumatic.  Eyes: conjunctivae/corneas clear.  EOM's intact.   Throat: normal  Neck:  normal   Lungs:    clear   Heart:  Rr   Abdomen:  Soft, non-tender, non-distended    Extremities: No edema    Neurologic: A&O X3, CN II - XII are grossly intact.   Psych: Normal     Labs: BMET:  Recent Labs  08/15/15 2149  08/16/15 0610 08/17/15 0454  NA 134*  < > 136 138  K 3.3*  < > 3.7 3.6  CL 107  < > 107 107  CO2 20*  --  20* 23  GLUCOSE 149*  < > 110* 104*  BUN 14  < > 11 12  CREATININE 0.93  < > 0.72 0.70  CALCIUM 8.4*  --  8.8* 9.1  MG 2.0  --   --   --   PHOS 2.2*  --   --  4.2  < > = values in this interval not displayed.  Liver function tests: No results for input(s): AST, ALT, ALKPHOS, BILITOT, PROT, ALBUMIN in the last 72 hours. No results for input(s): LIPASE, AMYLASE in the last 72 hours.  CBC:  Recent Labs  08/16/15 0225 08/16/15 0610  WBC 8.0 7.2  HGB 13.9 13.8  HCT 40.1 40.6  MCV 90.5 91.2  PLT 222 218    Cardiac Enzymes:  Recent Labs  08/16/15 0225 08/16/15 0610 08/16/15 0909 08/16/15 1138  CKTOTAL  --   --   --  152  TROPONINI <0.03 <0.03 <0.03  --     Coagulation Studies:  Recent Labs  08/15/15 2149  LABPROT 15.6*  INR 1.23    Other: Invalid input(s): POCBNP No results for input(s): DDIMER in the last 72 hours. No results for input(s): HGBA1C in the last 72 hours. No results for input(s): CHOL, HDL, LDLCALC, TRIG, CHOLHDL in the last 72 hours. No results for input(s): TSH, T4TOTAL, T3FREE, THYROIDAB in the last 72 hours.  Invalid input(s): FREET3 No results for input(s): VITAMINB12, FOLATE, FERRITIN, TIBC, IRON, RETICCTPCT in the last 72 hours.   Other results:   tele ( personally reviewed )    - NSR   Medications:    Infusions:  Scheduled Medications: . enoxaparin (LOVENOX) injection  40 mg Subcutaneous Daily  . regadenoson  0.4 mg Intravenous Once  . sodium chloride flush  3 mL Intravenous Q12H    Assessment/ Plan:   Principal Problem:   Syncope and collapse Active Problems:   Hypophosphatemia   Marijuana use  1. Syncope :   Unclear etiology.  Doubt ischemia but he is scheduled for a myoview today     Disposition:   Length of Stay:   Alvia Grove., MD, [facility] 08/17/2015, 12:37 PM Office [phone] Pager [phone]    Addendum:  Myoview is negative . Will arrange for him to get a 30 day event monitor      Kristeen Miss, MD  08/17/2015 4:57 PM    [facility] [address] [place],  [address] [place], [place]  [address] Pager [phone] Phone: [phone]; Fax: [phone]

## 2015-08-17 NOTE — Procedures (Signed)
HPI:  45 y/o with syncope  TECHNICAL SUMMARY:  A multichannel referential and bipolar montage EEG using the standard international 10-20 system was performed on the patient described as awake and drowsy.  The dominant background activity consists of 10 hertz activity seen most prominantly over the posterior head region.  The backgound activity is reactive to eye opening and closing procedures.  Low voltage fast (beta) activity is distributed symmetrically and maximally over the anterior head regions.  ACTIVATION:  Stepwise photic stimulation at 4-20 flashes per second was performed and did not elicit any abnormal waveforms but did produce a symmetric driving response.  Hyperventilation was performed for 3 minutes with good patient effort and produced no changes in the background activity.  EPILEPTIFORM ACTIVITY:  There were no spikes, sharp waves or paroxysmal activity.  SLEEP:  Physiologic drowsiness is noted but no stage 2 sleep  CARDIAC:  The EKG lead revealed a regular sinus rhythm.  IMPRESSION:  This is a normal EEG for the patients stated age.  There were no focal, hemispheric or lateralizing features.  No epileptiform activity was recorded.  A normal EEG does not exclude the diagnosis of a seizure disorder and if seizure remains high on the list of differential diagnosis, an ambulatory EEG may be of value.  Clinical correlation is required.

## 2015-08-17 NOTE — Progress Notes (Signed)
EEG completed bedside full report to follow. °

## 2015-08-17 NOTE — Discharge Summary (Addendum)
Physician Discharge Summary  Justin Valentine JXB:[id number] DOB: 26-Jan-1971 DOA: 08/15/2015  PCP: No PCP Per Patient  Admit date: 08/15/2015 Discharge date: 08/17/2015  Admitted From: home Disposition:  Home  Recommendations for Outpatient Follow-up:  1. Follow up with PCP in 1-2 weeks 2. Ambulatory referral to neurology for possible seizure; recurrent syncope  Home Health:No Equipment/Devices:none  Discharge Condition:stable CODE STATUS:FULL Diet recommendation: Heart Healthy   Brief/Interim Summary 45 y/o male with no chronic medical problems presents with syncope x 2 episodes on 08/15/15. Pt has had hx of syncope for past 7-8 years, but for the last 5 months, he has been having more frequent episodes of syncope, approximately every 2-3 weeks. On 08/15/15, the pt was watching a movie sitting on couch when first episode occurred. He was laying down in bed when second episode occurred. He denies biting tongue or urine or bowel incontinence. However, he has had prior episodes where he has had urine incontinence. He denies any aura prior to episodes, but states he feels "dizzy" for about one hour after his episodes. He states he has diaphoresis/sweating before his episodes without any cp or sob. His significant other states that in the past she had seen some "shaking" activity, but not in the past few weeks. No family hx to suggest sudden cardiac death. In the ED, phos was 2.2, but remainder of labs essentially unremarkable. EKG showed nonspecific T-wave changes and early repolarization.   Discharge Diagnoses:  Syncope -echo--EF 55-60%, no WMA -orthostatics negative -EEG--neg -check CPK--152 -remain on tele--no concerning events noted -UDS--positive THC -consult cardiology--recommended myoview which was negative -cardiology to try set pt up with event monitor as outpt -I told patient he cannot drive nor work at heights above ground until cleared by cardiology and  neurology -CTA chest neg for PE -troponin neg x 2 -request outpt referral to [facility]--pt will likely need ambulatory EEG  Hypophosphatemia -repleted  Pulmonary nodules -incidental finding on CT -will need outpt surveillance/followup  Marijuana use -cessation discussed   Discharge Instructions      Discharge Instructions    Ambulatory referral to Neurology    Complete by:  As directed   Refer to [facility]; recurrent syncope concerning for seizure;  Call [phone] for appointment     Diet - low sodium heart healthy    Complete by:  As directed      Increase activity slowly    Complete by:  As directed             Medication List    Notice    You have not been prescribed any medications.     Follow-up Information    Follow up with Armanda Magic, MD.   Specialty:  Cardiology   Why:  Cardiology office will contact you to arrange 30 day event monitor and outpatient 6 weeks followup, please give [place] a call if you do not hear from [place] in 2-3 business days   Contact information:   [address] [place] [address] [phone]      No Known Allergies  Consultations:  Cardiology   Procedures/Studies: Ct Angio Chest Pe W/cm &/or Wo Cm  08/16/2015  CLINICAL DATA:  45 year old male with syncope. EXAM: CT ANGIOGRAPHY CHEST WITH CONTRAST TECHNIQUE: Multidetector CT imaging of the chest was performed using the standard protocol during bolus administration of intravenous contrast. Multiplanar CT image reconstructions and MIPs were obtained to evaluate the vascular anatomy. CONTRAST:  100 cc Isovue 370 COMPARISON:  Chest radiograph dated  08/15/2015 FINDINGS: There are biapical scarring. A 6 x 13 mm area of nodularity in the left upper lobe extending into the left pleural surface likely represent scarring. A 7 mm nodular density in the left apical region noted, also possibly scarring. Direct comparison with prior chest CTs, if available, or  follow-up recommended to document stability. The lungs are otherwise clear. A 4 mm calcific focus in the right middle lobe likely related to old granuloma. There is no pleural effusion or pneumothorax. The central airways are patent. The thoracic aorta appears unremarkable. There is no CT evidence of pulmonary embolism. There is no cardiomegaly or pericardial effusion. There is no hilar or mediastinal adenopathy. The esophagus is grossly unremarkable. There is no axillary adenopathy. The chest wall soft tissues appear unremarkable. The osseous structures are intact. The visualized upper abdomen is unremarkable. Review of the MIP images confirms the above findings. IMPRESSION: No CT evidence of pulmonary embolism. Biapical nodularity and scarring. A 7 mm left upper lobe pulmonary nodules versus scarring. Non-contrast chest CT at 6-12 months is recommended. If the nodule is stable at time of repeat CT, then future CT at 18-24 months (from today's scan) is considered optional for low-risk patients, but is recommended for high-risk patients. This recommendation follows the consensus statement: Guidelines for Management of Incidental Pulmonary Nodules Detected on CT Images:From the Fleischner Society 2017; published online before print (10.1148/[id number]). Electronically Signed   By: Elgie CollardArash  Radparvar M.D.   On: 08/16/2015 02:11   Nm Myocar Multi W/spect W/wall Motion / Ef  08/17/2015   There was no ST segment deviation noted during stress.  No T wave inversion was noted during stress.  Nuclear stress EF: 53%.  The study is normal.  Low risk stress nuclear study with normal perfusion and borderline left ventricular global systolic function.   Dg Chest Portable 1 View  08/15/2015  CLINICAL DATA:  Shortness of breath. EXAM: PORTABLE CHEST 1 VIEW COMPARISON:  10/01/2008. FINDINGS: The normal sized heart. Probable heart shaped artifact overlying the left hilar region with associated leads. Otherwise, clear  lungs. Mild scoliosis. IMPRESSION: No acute abnormality. Electronically Signed   By: Beckie SaltsSteven  Reid M.D.   On: 08/15/2015 22:15       Discharge Exam: Filed Vitals:   08/17/15 1435 08/17/15 1436  BP: 133/71   Pulse: 88 85  Temp:    Resp:     Filed Vitals:   08/17/15 1432 08/17/15 1434 08/17/15 1435 08/17/15 1436  BP: 127/76 130/70 133/71   Pulse: 63 100 88 85  Temp:      TempSrc:      Resp:      Height:      Weight:      SpO2:        General: Pt is alert, awake, not in acute distress Cardiovascular: RRR, S1/S2 +, no rubs, no gallops Respiratory: CTA bilaterally, no wheezing, no rhonchi Abdominal: Soft, NT, ND, bowel sounds + Extremities: no edema, no cyanosis   The results of significant diagnostics from this hospitalization (including imaging, microbiology, ancillary and laboratory) are listed below for reference.    Significant Diagnostic Studies: Ct Angio Chest Pe W/cm &/or Wo Cm  08/16/2015  CLINICAL DATA:  45 year old male with syncope. EXAM: CT ANGIOGRAPHY CHEST WITH CONTRAST TECHNIQUE: Multidetector CT imaging of the chest was performed using the standard protocol during bolus administration of intravenous contrast. Multiplanar CT image reconstructions and MIPs were obtained to evaluate the vascular anatomy. CONTRAST:  100 cc Isovue 370 COMPARISON:  Chest radiograph dated 08/15/2015 FINDINGS: There are biapical scarring. A 6 x 13 mm area of nodularity in the left upper lobe extending into the left pleural surface likely represent scarring. A 7 mm nodular density in the left apical region noted, also possibly scarring. Direct comparison with prior chest CTs, if available, or follow-up recommended to document stability. The lungs are otherwise clear. A 4 mm calcific focus in the right middle lobe likely related to old granuloma. There is no pleural effusion or pneumothorax. The central airways are patent. The thoracic aorta appears unremarkable. There is no CT evidence of  pulmonary embolism. There is no cardiomegaly or pericardial effusion. There is no hilar or mediastinal adenopathy. The esophagus is grossly unremarkable. There is no axillary adenopathy. The chest wall soft tissues appear unremarkable. The osseous structures are intact. The visualized upper abdomen is unremarkable. Review of the MIP images confirms the above findings. IMPRESSION: No CT evidence of pulmonary embolism. Biapical nodularity and scarring. A 7 mm left upper lobe pulmonary nodules versus scarring. Non-contrast chest CT at 6-12 months is recommended. If the nodule is stable at time of repeat CT, then future CT at 18-24 months (from today's scan) is considered optional for low-risk patients, but is recommended for high-risk patients. This recommendation follows the consensus statement: Guidelines for Management of Incidental Pulmonary Nodules Detected on CT Images:From the Fleischner Society 2017; published online before print (10.1148/[id number]). Electronically Signed   By: Elgie Collard M.D.   On: 08/16/2015 02:11   Nm Myocar Multi W/spect W/wall Motion / Ef  08/17/2015   There was no ST segment deviation noted during stress.  No T wave inversion was noted during stress.  Nuclear stress EF: 53%.  The study is normal.  Low risk stress nuclear study with normal perfusion and borderline left ventricular global systolic function.   Dg Chest Portable 1 View  08/15/2015  CLINICAL DATA:  Shortness of breath. EXAM: PORTABLE CHEST 1 VIEW COMPARISON:  10/01/2008. FINDINGS: The normal sized heart. Probable heart shaped artifact overlying the left hilar region with associated leads. Otherwise, clear lungs. Mild scoliosis. IMPRESSION: No acute abnormality. Electronically Signed   By: Beckie Salts M.D.   On: 08/15/2015 22:15     Microbiology: No results found for this or any previous visit (from the past 240 hour(s)).   Labs: Basic Metabolic Panel:  Recent Labs Lab 08/15/15 2149  08/15/15 2201 08/16/15 0225 08/16/15 0610 08/17/15 0454  NA 134* 140  --  136 138  K 3.3* 3.3*  --  3.7 3.6  CL 107 103  --  107 107  CO2 20*  --   --  20* 23  GLUCOSE 149* 151*  --  110* 104*  BUN 14 17  --  11 12  CREATININE 0.93 0.90 0.71 0.72 0.70  CALCIUM 8.4*  --   --  8.8* 9.1  MG 2.0  --   --   --   --   PHOS 2.2*  --   --   --  4.2   Liver Function Tests: No results for input(s): AST, ALT, ALKPHOS, BILITOT, PROT, ALBUMIN in the last 168 hours. No results for input(s): LIPASE, AMYLASE in the last 168 hours. No results for input(s): AMMONIA in the last 168 hours. CBC:  Recent Labs Lab 08/15/15 2149 08/15/15 2201 08/16/15 0225 08/16/15 0610  WBC 11.4*  --  8.0 7.2  HGB 13.3 14.3 13.9 13.8  HCT 38.9* 42.0 40.1 40.6  MCV 90.3  --  90.5 91.2  PLT 210  --  222 218   Cardiac Enzymes:  Recent Labs Lab 08/16/15 0225 08/16/15 0610 08/16/15 0909 08/16/15 1138  CKTOTAL  --   --   --  152  TROPONINI <0.03 <0.03 <0.03  --    BNP: Invalid input(s): POCBNP CBG: No results for input(s): GLUCAP in the last 168 hours.  Time coordinating discharge:  Greater than 30 minutes  Signed:  Auron Tadros, DO [facility] Pager: [phone] 08/17/2015, 6:07 PM

## 2015-08-17 NOTE — Progress Notes (Signed)
1 day lexiscan stress test completed without complication. Treadmill changed to lexiscan given syncope. [facility]  Signed, Azalee CourseHao Loriana Samad PA Pager: [phone]

## 2015-08-17 NOTE — Progress Notes (Signed)
  Echocardiogram 2D Echocardiogram has been performed.  Leta JunglingCooper, Yuvaan Olander M 08/17/2015, 9:54 AM

## 2015-08-17 NOTE — Progress Notes (Signed)
VASCULAR LAB PRELIMINARY  PRELIMINARY  PRELIMINARY  PRELIMINARY  Carotid duplex completed.    Preliminary report:  There is no significant ICA stenosis noted.  Vertebral artery flow is antegrade.   Jaeleen Inzunza, RVT 08/17/2015, 10:14 AM

## 2015-08-20 ENCOUNTER — Other Ambulatory Visit: Payer: Self-pay | Admitting: Physician Assistant

## 2015-08-20 DIAGNOSIS — R55 Syncope and collapse: Secondary | ICD-10-CM

## 2015-08-26 ENCOUNTER — Ambulatory Visit: Payer: Self-pay

## 2015-09-16 ENCOUNTER — Ambulatory Visit: Payer: Self-pay

## 2015-10-05 ENCOUNTER — Ambulatory Visit: Payer: Self-pay | Admitting: Neurology

## 2015-11-13 ENCOUNTER — Ambulatory Visit: Payer: Self-pay | Admitting: Neurology

## 2015-11-13 DIAGNOSIS — Z029 Encounter for administrative examinations, unspecified: Secondary | ICD-10-CM

## 2019-12-21 ENCOUNTER — Emergency Department (HOSPITAL_COMMUNITY)
Admission: EM | Admit: 2019-12-21 | Discharge: 2019-12-22 | Disposition: A | Discharge status: Home / Self Care | Payer: Self-pay | Attending: Emergency Medicine | Admitting: Emergency Medicine

## 2019-12-21 ENCOUNTER — Emergency Department (HOSPITAL_COMMUNITY): Payer: Self-pay

## 2019-12-21 ENCOUNTER — Encounter (HOSPITAL_COMMUNITY): Payer: Self-pay | Admitting: Emergency Medicine

## 2019-12-21 ENCOUNTER — Other Ambulatory Visit: Payer: Self-pay

## 2019-12-21 DIAGNOSIS — R42 Dizziness and giddiness: Secondary | ICD-10-CM | POA: Insufficient documentation

## 2019-12-21 DIAGNOSIS — Z5321 Procedure and treatment not carried out due to patient leaving prior to being seen by health care provider: Secondary | ICD-10-CM | POA: Insufficient documentation

## 2019-12-21 DIAGNOSIS — R61 Generalized hyperhidrosis: Secondary | ICD-10-CM | POA: Insufficient documentation

## 2019-12-21 LAB — COMPREHENSIVE METABOLIC PANEL
ALT: 24 U/L (ref 0–44)
AST: 32 U/L (ref 15–41)
Albumin: 3.9 g/dL (ref 3.5–5.0)
Alkaline Phosphatase: 77 U/L (ref 38–126)
Anion gap: 10 (ref 5–15)
BUN: 11 mg/dL (ref 6–20)
CO2: 22 mmol/L (ref 22–32)
Calcium: 8.9 mg/dL (ref 8.9–10.3)
Chloride: 103 mmol/L (ref 98–111)
Creatinine, Ser: 0.69 mg/dL (ref 0.61–1.24)
GFR, Estimated: >60 mL/min (ref >60)
Glucose, Bld: 119 mg/dL — ABNORMAL HIGH (ref 70–99)
Potassium: 3.5 mmol/L (ref 3.5–5.1)
Sodium: 135 mmol/L (ref 135–145)
Total Bilirubin: 0.5 mg/dL (ref 0.3–1.2)
Total Protein: 7.1 g/dL (ref 6.5–8.1)

## 2019-12-21 LAB — URINALYSIS, ROUTINE W REFLEX MICROSCOPIC
Bilirubin Urine: NEGATIVE
Glucose, UA: NEGATIVE mg/dL
Hgb urine dipstick: NEGATIVE
Ketones, ur: NEGATIVE mg/dL
Leukocytes,Ua: NEGATIVE
Nitrite: NEGATIVE
Protein, ur: NEGATIVE mg/dL
Specific Gravity, Urine: 1.006 (ref 1.005–1.030)
pH: 7 (ref 5.0–8.0)

## 2019-12-21 LAB — CBC WITH DIFFERENTIAL/PLATELET
Abs Immature Granulocytes: 0.01 10*3/uL (ref 0.00–0.07)
Basophils Absolute: 0 10*3/uL (ref 0.0–0.1)
Basophils Relative: 0 %
Eosinophils Absolute: 0 10*3/uL (ref 0.0–0.5)
Eosinophils Relative: 0 %
HCT: 41.4 % (ref 39.0–52.0)
Hemoglobin: 14.3 g/dL (ref 13.0–17.0)
Immature Granulocytes: 0 %
Lymphocytes Relative: 24 %
Lymphs Abs: 1.4 10*3/uL (ref 0.7–4.0)
MCH: 32.2 pg (ref 26.0–34.0)
MCHC: 34.5 g/dL (ref 30.0–36.0)
MCV: 93.2 fL (ref 80.0–100.0)
Monocytes Absolute: 0.6 10*3/uL (ref 0.1–1.0)
Monocytes Relative: 10 %
Neutro Abs: 4 10*3/uL (ref 1.7–7.7)
Neutrophils Relative %: 66 %
Platelets: 151 10*3/uL (ref 150–400)
RBC: 4.44 MIL/uL (ref 4.22–5.81)
RDW: 12.6 % (ref 11.5–15.5)
WBC: 6.1 10*3/uL (ref 4.0–10.5)
nRBC: 0 % (ref 0.0–0.2)

## 2019-12-21 NOTE — ED Triage Notes (Signed)
Patient reports dizziness/ligheaded with diaphoresis at night for 3 days , denies fever or chills, respirations unlabored.

## 2019-12-22 NOTE — ED Notes (Signed)
Family states they are leaving and will return if he gets dizzy

## 2019-12-22 NOTE — ED Notes (Signed)
Pt and family member asleep, refusing vitals

## 2022-01-06 IMAGING — CR DG CHEST 2V
2 series · 2 of 2 positions shown · non-contrast
Comparison: 08/15/2015

CLINICAL DATA: Night sweat

EXAM:
CHEST - 2 VIEW

[chest pa]
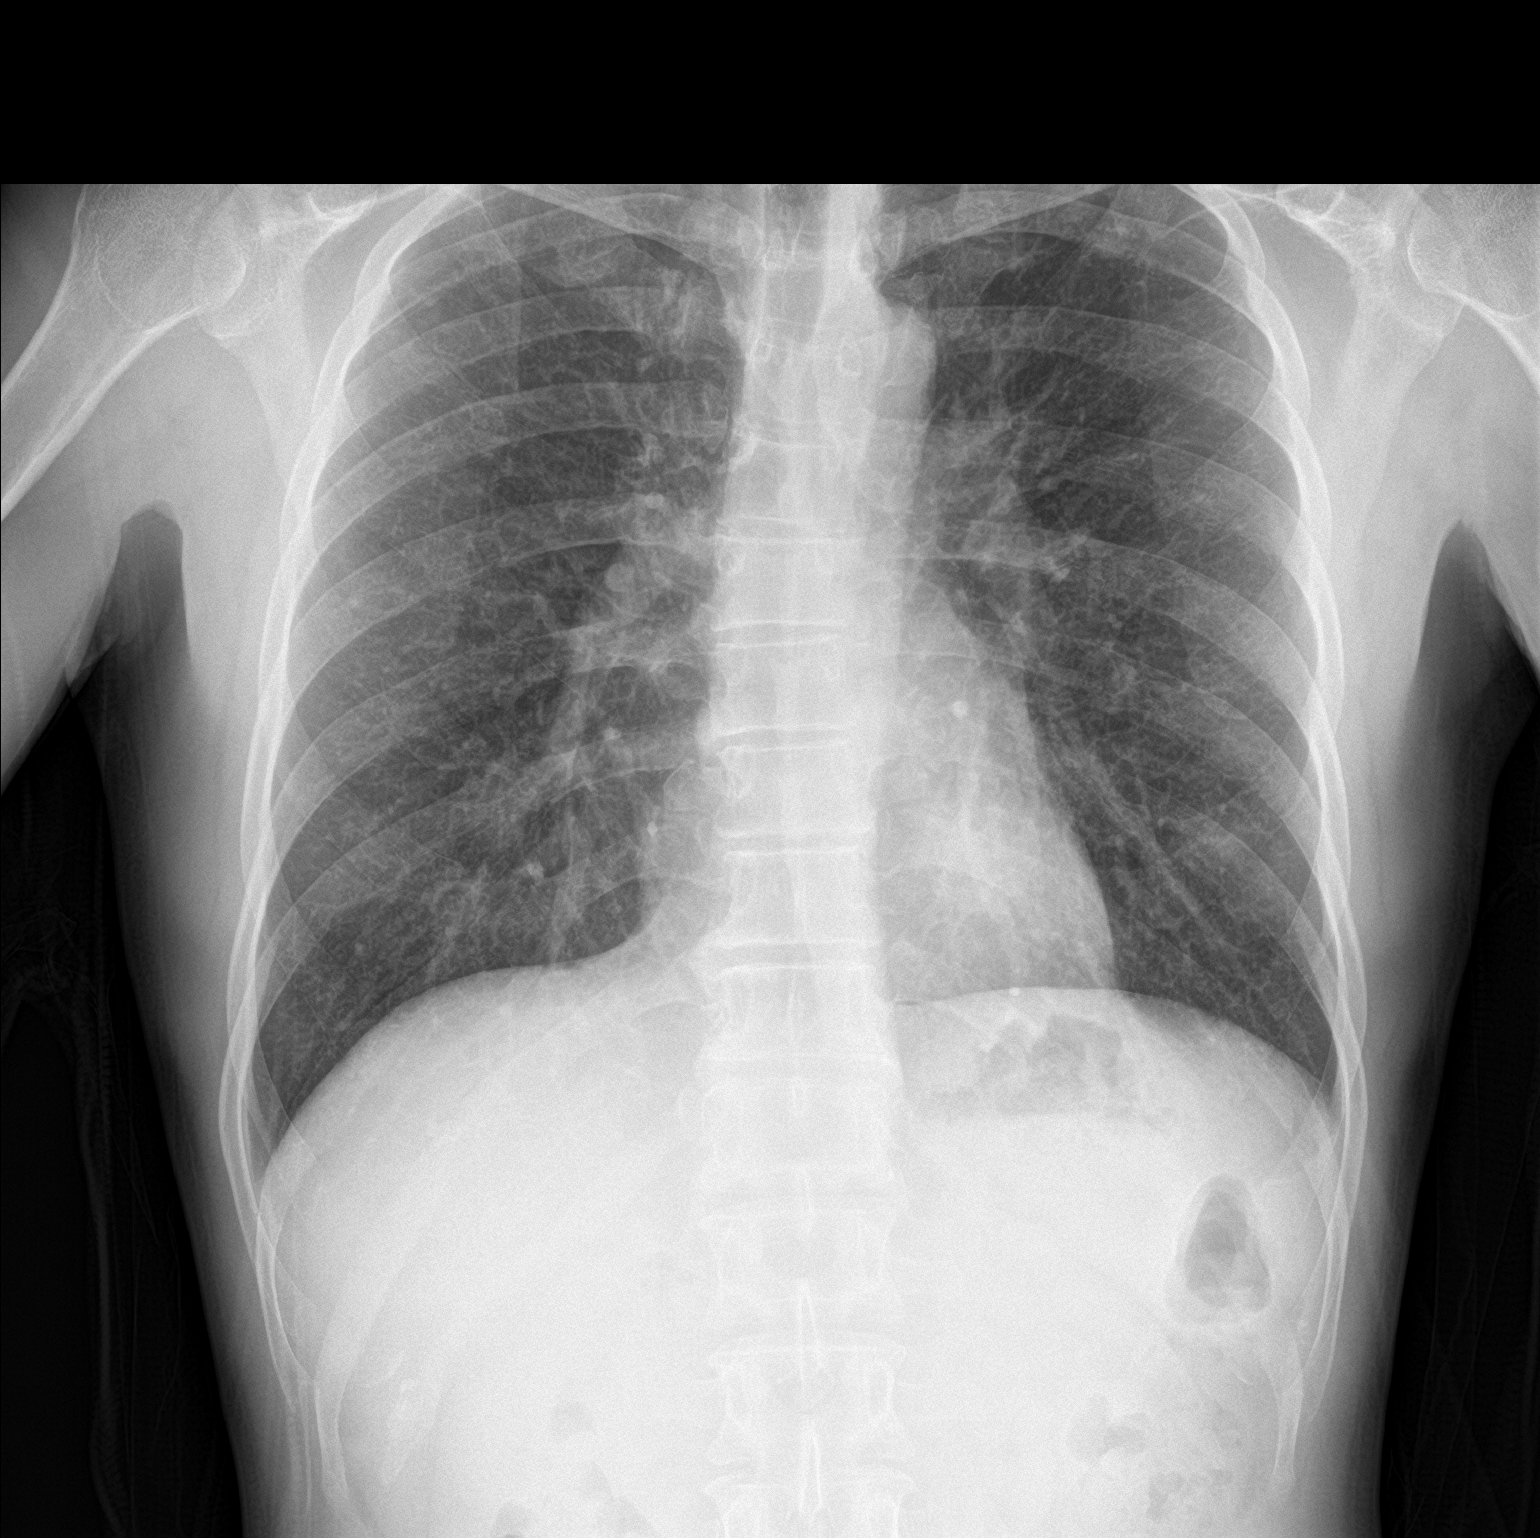

[chest lat]
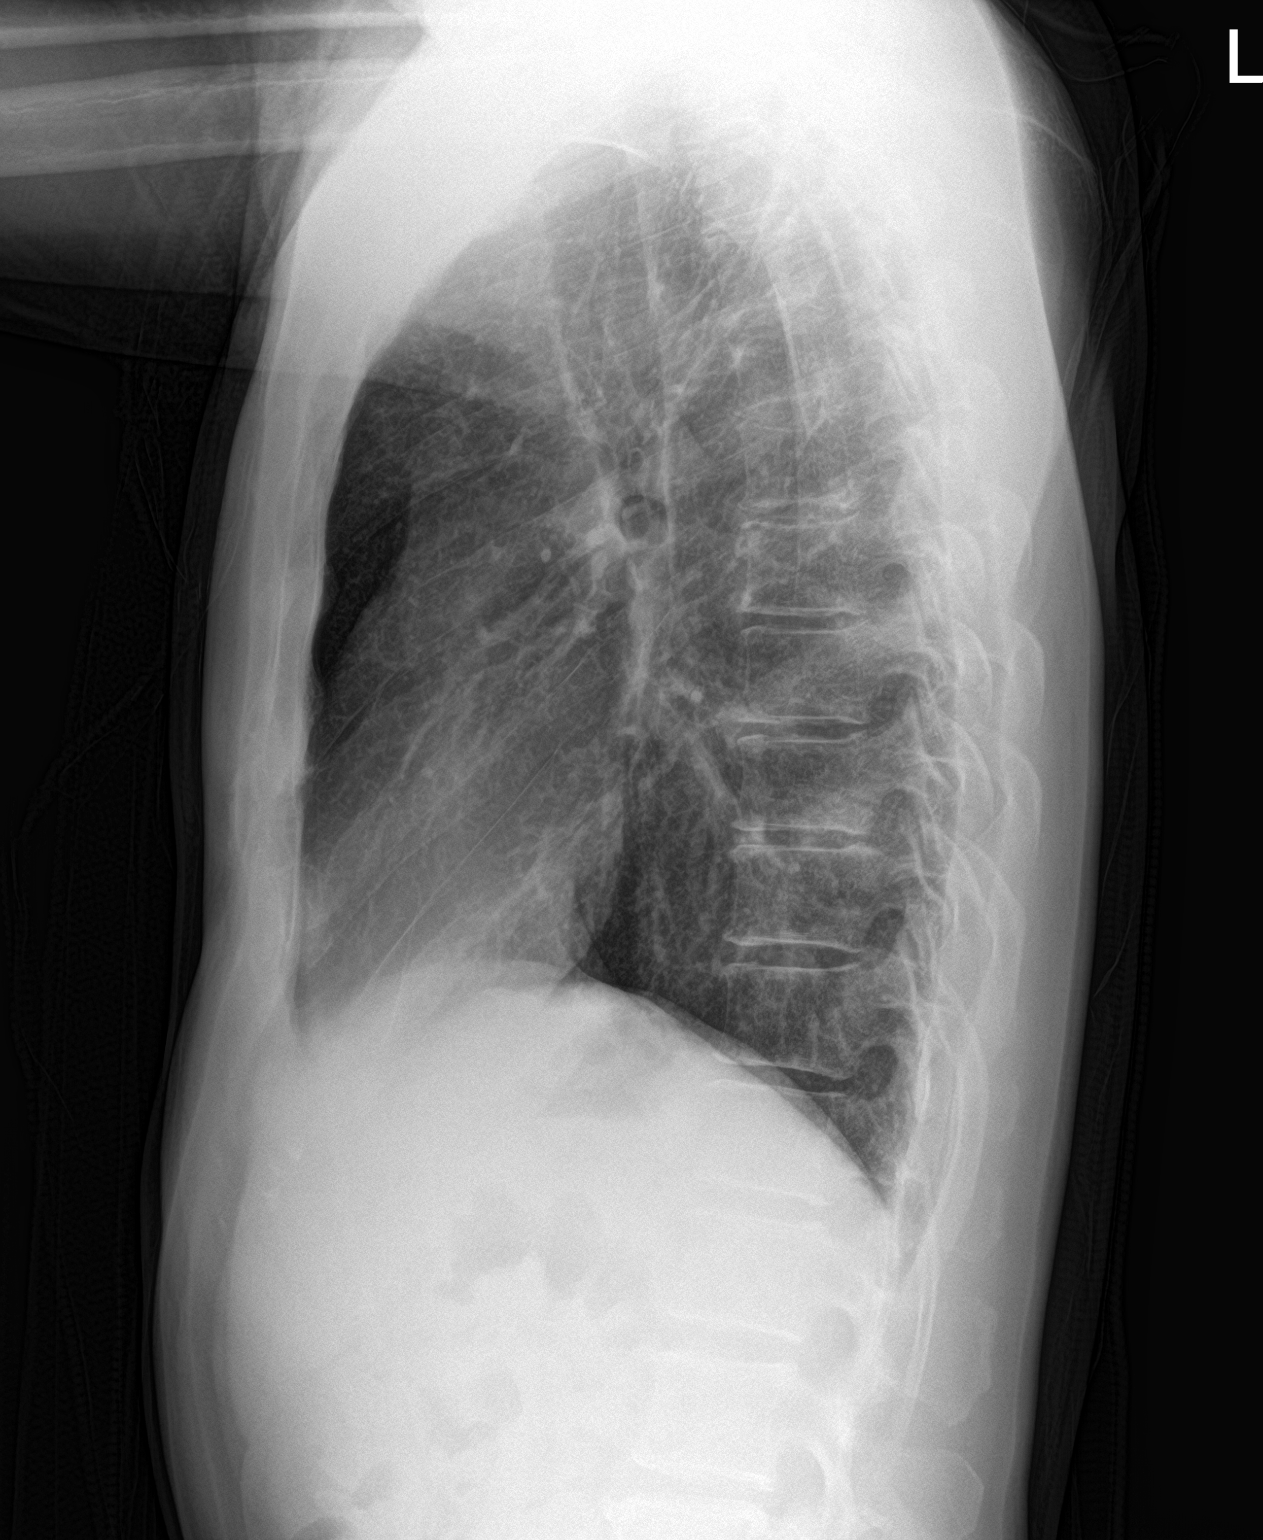

[2 of 2 positions shown; findings below may reference images not displayed]

FINDINGS: The heart size and mediastinal contours are within normal limits.
Both lungs are clear. The visualized skeletal structures are
unremarkable.
IMPRESSION: No acute abnormality of the lungs.
